# Patient Record
Sex: Male | Born: 1995 | Race: White | Hispanic: No | Marital: Single | Smoking: Never smoker
Health system: Southern US, Community
[De-identification: ages and names within clinical notes are randomized; demographics above are authoritative.]

## PROBLEM LIST (undated history)

## (undated) DIAGNOSIS — R002 Palpitations: Secondary | ICD-10-CM

## (undated) DIAGNOSIS — A048 Other specified bacterial intestinal infections: Secondary | ICD-10-CM

---
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
## 2014-11-22 ENCOUNTER — Emergency Department (HOSPITAL_COMMUNITY): Payer: BLUE CROSS/BLUE SHIELD

## 2014-11-22 ENCOUNTER — Encounter (HOSPITAL_COMMUNITY): Payer: Self-pay | Admitting: Emergency Medicine

## 2014-11-22 ENCOUNTER — Emergency Department (HOSPITAL_COMMUNITY)
Admission: EM | Admit: 2014-11-22 | Discharge: 2014-11-22 | Disposition: A | Discharge status: Home / Self Care | Payer: BLUE CROSS/BLUE SHIELD | Attending: Emergency Medicine | Admitting: Emergency Medicine

## 2014-11-22 DIAGNOSIS — Z880 Allergy status to penicillin: Secondary | ICD-10-CM | POA: Insufficient documentation

## 2014-11-22 DIAGNOSIS — K921 Melena: Secondary | ICD-10-CM | POA: Insufficient documentation

## 2014-11-22 DIAGNOSIS — R42 Dizziness and giddiness: Secondary | ICD-10-CM | POA: Insufficient documentation

## 2014-11-22 DIAGNOSIS — Z8619 Personal history of other infectious and parasitic diseases: Secondary | ICD-10-CM | POA: Insufficient documentation

## 2014-11-22 DIAGNOSIS — Z79899 Other long term (current) drug therapy: Secondary | ICD-10-CM | POA: Insufficient documentation

## 2014-11-22 DIAGNOSIS — R109 Unspecified abdominal pain: Secondary | ICD-10-CM | POA: Diagnosis present

## 2014-11-22 DIAGNOSIS — K529 Noninfective gastroenteritis and colitis, unspecified: Secondary | ICD-10-CM | POA: Diagnosis not present

## 2014-11-22 HISTORY — DX: Other specified bacterial intestinal infections: A04.8

## 2014-11-22 LAB — URINALYSIS, ROUTINE W REFLEX MICROSCOPIC
Bilirubin Urine: NEGATIVE
Glucose, UA: NEGATIVE mg/dL
HGB URINE DIPSTICK: NEGATIVE
Ketones, ur: NEGATIVE mg/dL
LEUKOCYTES UA: NEGATIVE
Nitrite: NEGATIVE
Protein, ur: NEGATIVE mg/dL
Specific Gravity, Urine: 1.013 (ref 1.005–1.030)
pH: 5.5 (ref 5.0–8.0)

## 2014-11-22 LAB — I-STAT CHEM 8, ED
BUN: 17 mg/dL (ref 6–20)
BUN: 16 mg/dL (ref 6–20)
CALCIUM ION: 1.17 mmol/L (ref 1.12–1.23)
CREATININE: 0.8 mg/dL (ref 0.61–1.24)
Calcium, Ion: 1.21 mmol/L (ref 1.12–1.23)
Chloride: 101 mmol/L (ref 101–111)
Chloride: 104 mmol/L (ref 101–111)
Creatinine, Ser: 1 mg/dL (ref 0.61–1.24)
GLUCOSE: 92 mg/dL (ref 65–99)
Glucose, Bld: 115 mg/dL — ABNORMAL HIGH (ref 65–99)
HCT: 44 % (ref 39.0–52.0)
HCT: 38 % — ABNORMAL LOW (ref 39.0–52.0)
Hemoglobin: 15 g/dL (ref 13.0–17.0)
Hemoglobin: 12.9 g/dL — ABNORMAL LOW (ref 13.0–17.0)
Potassium: 3.7 mmol/L (ref 3.5–5.1)
Potassium: 4.2 mmol/L (ref 3.5–5.1)
SODIUM: 140 mmol/L (ref 135–145)
Sodium: 140 mmol/L (ref 135–145)
TCO2: 26 mmol/L (ref 0–100)
TCO2: 24 mmol/L (ref 0–100)

## 2014-11-22 LAB — CBC
HCT: 42.2 % (ref 39.0–52.0)
Hemoglobin: 14.4 g/dL (ref 13.0–17.0)
MCH: 31.2 pg (ref 26.0–34.0)
MCHC: 34.1 g/dL (ref 30.0–36.0)
MCV: 91.5 fL (ref 78.0–100.0)
PLATELETS: 302 10*3/uL (ref 150–400)
RBC: 4.61 MIL/uL (ref 4.22–5.81)
RDW: 12.4 % (ref 11.5–15.5)
WBC: 10.7 10*3/uL — AB (ref 4.0–10.5)

## 2014-11-22 LAB — COMPREHENSIVE METABOLIC PANEL
ALT: 23 U/L (ref 17–63)
AST: 51 U/L — AB (ref 15–41)
Albumin: 4.2 g/dL (ref 3.5–5.0)
Alkaline Phosphatase: 61 U/L (ref 38–126)
Anion gap: 6 (ref 5–15)
BILIRUBIN TOTAL: 0.8 mg/dL (ref 0.3–1.2)
BUN: 17 mg/dL (ref 6–20)
CALCIUM: 9 mg/dL (ref 8.9–10.3)
CO2: 27 mmol/L (ref 22–32)
CREATININE: 0.95 mg/dL (ref 0.61–1.24)
Chloride: 105 mmol/L (ref 101–111)
Glucose, Bld: 119 mg/dL — ABNORMAL HIGH (ref 65–99)
Potassium: 3.8 mmol/L (ref 3.5–5.1)
SODIUM: 138 mmol/L (ref 135–145)
TOTAL PROTEIN: 6.8 g/dL (ref 6.5–8.1)

## 2014-11-22 LAB — POC OCCULT BLOOD, ED
Fecal Occult Bld: POSITIVE — AB
Fecal Occult Bld: POSITIVE — AB

## 2014-11-22 LAB — PROTIME-INR
INR: 1.04 (ref 0.00–1.49)
PROTHROMBIN TIME: 13.8 s (ref 11.6–15.2)

## 2014-11-22 LAB — SAMPLE TO BLOOD BANK

## 2014-11-22 LAB — LIPASE, BLOOD: LIPASE: 34 U/L (ref 11–51)

## 2014-11-22 MED ORDER — METRONIDAZOLE 500 MG PO TABS: 500.0000 mg | ORAL_TABLET | Freq: Three times a day (TID) | ORAL | Status: AC

## 2014-11-22 MED ORDER — SODIUM CHLORIDE 0.9 % IV BOLUS (SEPSIS)
2000.0000 mL | Freq: Once | INTRAVENOUS | Status: AC
  Administered 2014-11-22: 2000 mL via INTRAVENOUS

## 2014-11-22 MED ORDER — MORPHINE SULFATE (PF) 4 MG/ML IV SOLN
4.0000 mg | Freq: Once | INTRAVENOUS | Status: AC
  Administered 2014-11-22: 4 mg via INTRAVENOUS

## 2014-11-22 MED ORDER — METHYLPREDNISOLONE SODIUM SUCC 125 MG IJ SOLR
125.0000 mg | Freq: Once | INTRAMUSCULAR | Status: AC
  Administered 2014-11-22: 125 mg via INTRAVENOUS

## 2014-11-22 MED ORDER — METRONIDAZOLE 500 MG PO TABS
500.0000 mg | ORAL_TABLET | Freq: Once | ORAL | Status: AC
  Administered 2014-11-22: 500 mg via ORAL

## 2014-11-22 MED ORDER — CIPROFLOXACIN HCL 500 MG PO TABS: 500.0000 mg | ORAL_TABLET | Freq: Two times a day (BID) | ORAL | Status: AC

## 2014-11-22 MED ORDER — CIPROFLOXACIN HCL 500 MG PO TABS
500.0000 mg | ORAL_TABLET | Freq: Once | ORAL | Status: AC
Start: 2014-11-22 — End: 2014-11-22
  Administered 2014-11-22: 500 mg via ORAL

## 2014-11-22 MED ORDER — ONDANSETRON HCL 4 MG/2ML IJ SOLN
4.0000 mg | Freq: Once | INTRAMUSCULAR | Status: AC
  Administered 2014-11-22: 4 mg via INTRAVENOUS

## 2014-11-22 MED ORDER — DICYCLOMINE HCL 20 MG PO TABS: 20.0000 mg | ORAL_TABLET | Freq: Two times a day (BID) | ORAL | Status: AC

## 2014-11-22 MED ORDER — IOHEXOL 300 MG/ML  SOLN
100.0000 mL | Freq: Once | INTRAMUSCULAR | Status: AC | PRN
  Administered 2014-11-22: 100 mL via INTRAVENOUS

## 2014-11-22 MED ORDER — IOHEXOL 300 MG/ML  SOLN
25.0000 mL | Freq: Once | INTRAMUSCULAR | Status: AC | PRN
  Administered 2014-11-22: 25 mL via ORAL

## 2014-11-22 NOTE — Discharge Instructions (Signed)
Take Ciprofloxacin and Flagyl as prescribed for symptoms. Take Bentyl as needed for pain. Follow up with a gastroenterologist. Make an appointment as soon as possible for a visit. It is possible that your symptoms may be due to Crohn's colitis, but you have no history of this. It is important to follow up with a gastroenterologist for this reason as they can perform further testing. It is likely that the blood in your stool will resolve with time. Return to the emergency department if you develop a fever over 100.4, loss of consciousness, severe worsening of your abdominal pain, lightheadedness or dizziness with change in position, or any of the symptoms listed below.  Colitis Colitis is inflammation of the colon. Colitis may last a short time (acute) or it may last a long time (chronic). CAUSES This condition may be caused by:  Viruses.  Bacteria.  Reactions to medicine.  Certain autoimmune diseases, such as Crohn disease or ulcerative colitis. SYMPTOMS Symptoms of this condition include:  Diarrhea.  Passing bloody or tarry stool.  Pain.  Fever.  Vomiting.  Tiredness (fatigue).  Weight loss.  Bloating.  Sudden increase in abdominal pain.  Having fewer bowel movements than usual. DIAGNOSIS This condition is diagnosed with a stool test or a blood test. You may also have other tests, including X-rays, a CT scan, or a colonoscopy. TREATMENT Treatment may include:  Resting the bowel. This involves not eating or drinking for a period of time.  Fluids that are given through an IV tube.  Medicine for pain and diarrhea.  Antibiotic medicines.  Cortisone medicines.  Surgery. HOME CARE INSTRUCTIONS Eating and Drinking  Follow instructions from your health care provider about eating or drinking restrictions.  Drink enough fluid to keep your urine clear or pale yellow.  Work with a dietitian to determine which foods cause your condition to flare up.  Avoid foods that  cause flare-ups.  Eat a well-balanced diet. Medicines  Take over-the-counter and prescription medicines only as told by your health care provider.  If you were prescribed an antibiotic medicine, take it as told by your health care provider. Do not stop taking the antibiotic even if you start to feel better. General Instructions  Keep all follow-up visits as told by your health care provider. This is important. SEEK MEDICAL CARE IF:  Your symptoms do not go away.  You develop new symptoms. SEEK IMMEDIATE MEDICAL CARE IF:  You have a fever that does not go away with treatment.  You develop chills.  You have extreme weakness, fainting, or dehydration.  You have repeated vomiting.  You develop severe pain in your abdomen.   This information is not intended to replace advice given to you by your health care provider. Make sure you discuss any questions you have with your health care provider.   Document Released: 01/31/2004 Document Revised: 09/13/2014 Document Reviewed: 04/17/2014 Elsevier Interactive Patient Education [employer]2016 Elsevier Inc.

## 2014-11-22 NOTE — ED Notes (Signed)
Pt aware that a urine sample is needed. Pt given urinal and ask to press call bell when he has a sample.

## 2014-11-22 NOTE — ED Provider Notes (Signed)
CSN: [id number]     Arrival date & time 11/22/14  0013 History   First MD Initiated Contact with Patient 11/22/14 0032     Chief Complaint  Patient presents with  . Abdominal Pain  . Blood In Stools     (Consider location/radiation/quality/duration/timing/severity/associated sxs/prior Treatment) HPI Comments: 19 year old male since to the emergency department for further evaluation of hematochezia. Patient reports that he was using the restroom tonight when he had onset of abdominal pain followed by an episode of gross hematochezia and an episode of emesis, nonbloody. He denies passing any stool. No modifying factors since onset of his symptoms. He reports that he has had abdominal pain proceeding his hematochezia 1.5 months. Abdominal pain is intermittent and waxing and waning in severity. Patient describes the pain as sharp. He reports that it is present primarily in his upper abdomen. He has vomited at times with this pain. Patient initially thought he had a gluten intolerance so he changed his diet. This did not provide any long-standing relief so he [facility]. He had a normal x-ray at this time and was given magnesium citrate to take. This has also not relieved his intermittent pain. Patient denies a hx of abdominal surgeries. No known personal or FHx of diverticulitis. Patient is sexual active with a male partner. He does engage in rectal intercourse.  Patient is a 19 y.o. male presenting with abdominal pain. The history is provided by the patient. No language interpreter was used.  Abdominal Pain Associated symptoms: nausea and vomiting   Associated symptoms: no chest pain, no dysuria, no fever and no shortness of breath     Past Medical History  Diagnosis Date  . Palpitations   . H. pylori infection    History reviewed. No pertinent past surgical history. History reviewed. No pertinent family history. Social History  Substance Use Topics  . Smoking  status: Never Smoker   . Smokeless tobacco: None  . Alcohol Use: Yes     Comment: socially    Review of Systems  Constitutional: Negative for fever.  Respiratory: Negative for shortness of breath.   Cardiovascular: Negative for chest pain.  Gastrointestinal: Positive for nausea, vomiting, abdominal pain and blood in stool.  Genitourinary: Negative for dysuria.  Neurological: Positive for light-headedness. Negative for syncope.  All other systems reviewed and are negative.   Allergies  Amoxicillin and Other  Home Medications   Prior to Admission medications   Medication Sig Start Date End Date Taking? Authorizing Provider  omeprazole (PRILOSEC) 20 MG capsule Take 20 mg by mouth daily.   Yes Historical Provider, MD  azithromycin (ZITHROMAX) 250 MG tablet Take 2 tablets PO day 1, then 1 tab PO daily x4 days. Patient not taking: Reported on 11/22/2014 02/10/13   Samuel JesterKathleen McManus, DO   BP 121/73 mmHg  Pulse 80  Temp(Src) 97.5 F (36.4 C) (Oral)  Resp 16  SpO2 100%   Physical Exam  Constitutional: He is oriented to person, place, and time. He appears well-developed and well-nourished. No distress.  Nontoxic/nonseptic appearing  HENT:  Head: Normocephalic and atraumatic.  Eyes: Conjunctivae and EOM are normal. No scleral icterus.  Neck: Normal range of motion.  Cardiovascular: Normal rate, regular rhythm and intact distal pulses.   Pulmonary/Chest: Effort normal. No respiratory distress.  Respirations even and unlabored  Abdominal: Soft. He exhibits no distension. There is tenderness. There is no rebound and no guarding.  Mild tenderness in the left upper quadrant. No masses or  peritoneal signs. No guarding.  Genitourinary:  Chaperoned rectal exam positive for gross maroon blood. Normal rectal tone. No hemorrhoids or fissure appreciated.  Musculoskeletal: Normal range of motion.  Neurological: He is alert and oriented to person, place, and time. He exhibits normal muscle tone.  Coordination normal.  Skin: Skin is warm and dry. No rash noted. He is not diaphoretic. No erythema. No pallor.  Psychiatric: He has a normal mood and affect. His behavior is normal.  Nursing note and vitals reviewed.   ED Course  Procedures (including critical care time) Labs Review Labs Reviewed  COMPREHENSIVE METABOLIC PANEL - Abnormal; Notable for the following:    Glucose, Bld 119 (*)    AST 51 (*)    All other components within normal limits  CBC - Abnormal; Notable for the following:    WBC 10.7 (*)    All other components within normal limits  I-STAT CHEM 8, ED - Abnormal; Notable for the following:    Glucose, Bld 115 (*)    All other components within normal limits  POC OCCULT BLOOD, ED - Abnormal; Notable for the following:    Fecal Occult Bld POSITIVE (*)    All other components within normal limits  POC OCCULT BLOOD, ED - Abnormal; Notable for the following:    Fecal Occult Bld POSITIVE (*)    All other components within normal limits  I-STAT CHEM 8, ED - Abnormal; Notable for the following:    Hemoglobin 12.9 (*)    HCT 38.0 (*)    All other components within normal limits  LIPASE, BLOOD  URINALYSIS, ROUTINE W REFLEX MICROSCOPIC (NOT AT [facility])  PROTIME-INR  SAMPLE TO BLOOD BANK    Imaging Review Ct Abdomen Pelvis W Contrast  11/22/2014  CLINICAL DATA:  Abdominal pain for several months. Hematochezia for 1 day. EXAM: CT ABDOMEN AND PELVIS WITH CONTRAST TECHNIQUE: Multidetector CT imaging of the abdomen and pelvis was performed using the standard protocol following bolus administration of intravenous contrast. CONTRAST:  OMNIPAQUE IOHEXOL 300 MG/ML  SOLN COMPARISON:  None. FINDINGS: Lower chest:  The included lung bases are clear. Liver: Normal.  No focal lesion. Hepatobiliary: Gallbladder physiologically distended. No calcified stone. No biliary dilatation. Pancreas: Normal.  No ductal dilatation or inflammation. Spleen: Normal. Adrenal glands: No nodule.  Kidneys: Symmetric renal enhancement. No hydronephrosis. No perinephric stranding or focal renal abnormality. Incidental note of small accessory renal arteries. Stomach/Bowel: Stomach physiologically distended. There are no dilated or thickened small bowel loops. The terminal ileum appears normal. Liquid and formed stool throughout the colon. Possible mild colonic wall thickening involving the hepatic flexure of the colon versus nondistention. No perienteric inflammatory change. The appendix is normal. Vascular/Lymphatic: No retroperitoneal adenopathy. Abdominal aorta is normal in caliber. Reproductive: Prostate gland normal in size. Bladder: Physiologically distended, no wall thickening. Other: No free air, free fluid, or intra-abdominal fluid collection. Tiny fat containing umbilical hernia. Musculoskeletal: There are no acute or suspicious osseous abnormalities. New bone island in the sacrum. Minimal broad-based levo scoliotic curvature of the lumbar spine. IMPRESSION: 1. Liquid and formed stool throughout the colon. Equivocal colonic wall thickening involving the hepatic flexure. This may reflect minimal colitis, however favor nondistention. Stomach and small bowel appear normal. 2. Otherwise no acute abnormality. Electronically Signed   By: Rubye Oaks M.D.   On: 11/22/2014 04:07     I have personally reviewed and evaluated these images and lab results as part of my medical decision-making.   EKG Interpretation None  MDM   Final diagnoses:  Colitis  Hematochezia    19 year old male with no significant past medical history presents to the emergency department for evaluation of abdominal pain and hematochezia. Patient with 1 episode of bright red blood per rectum this evening. He is grossly Hemoccult positive on exam. He reports that he has been experiencing intermittent abdominal pain over the last 1.5 months. He is afebrile and hemodynamically stable today. Only slight decrease in Hgb  over ED course from 14.4 to 12.9 which is fairly appropriate given blood draw. No other signs of acute blood loss such as elevated BUN, tachycardia, or hypotension. Patient denies lightheadedness or dizziness with position change. Orthostatic VSS.  CT today reveals a colitis. Question infectious etiology. Will cover with Ciprofloxacin and Flagyl. Crohn's colitis remains on the differential. Patient has no PHx of Crohn's. He has been given one dose of steroids in the ED. No evidence of diverticulosis on CT to suggest diverticular bleed. Will send for GI follow up.  Patient states that he is feeling better. He has no c/o abdominal pain. Given his reassuring laboratory workup and stable vital signs, do not believe admission is indicated at this time. Patient has been instructed to follow-up with a gastroenterologist on an outpatient basis. Have discussed and provided reasons to return to the emergency department for further evaluation of his symptoms. Bentyl prescribed for pain control as needed. Patient agreeable to plan with no unaddressed concerns. Patient discharged in satisfactory condition.   Filed Vitals:   11/22/14 0019 11/22/14 0235 11/22/14 0556  BP: 124/74 121/73 125/77  Pulse: 95 80 72  Temp: 97.5 F (36.4 C)    TempSrc: Oral    Resp: SpO2: 100% 100% 100%     Antony Madura, PA-C 11/22/14 0557  Gilda Crease, MD 11/22/14 [id number]

## 2014-11-22 NOTE — ED Notes (Signed)
Pt states that he has had ongoing abdominal pain x several months but tonight started having bleeding with his stools. Alert and oriented.

## 2016-06-27 IMAGING — CT CT ABD-PELV W/ CM
2 of 4 series · 16 of 46 positions shown, 18 images · IV contrast (100 ML OMNI 300)
Comparison: None.

CLINICAL DATA: Abdominal pain for several months. Hematochezia for
1 day.

EXAM:
CT ABDOMEN AND PELVIS WITH CONTRAST
TECHNIQUE: Multidetector CT imaging of the abdomen and pelvis was performed
using the standard protocol following bolus administration of
intravenous contrast.
CONTRAST:  100mL OMNIPAQUE IOHEXOL 300 MG/ML  SOLN

[Series 2: abd/pel with · axial · 0.70mm/px · z∈[+1239,+1639]mm · 13 of 88 slices shown, 15 images]
[im 4/88  soft-tissue]
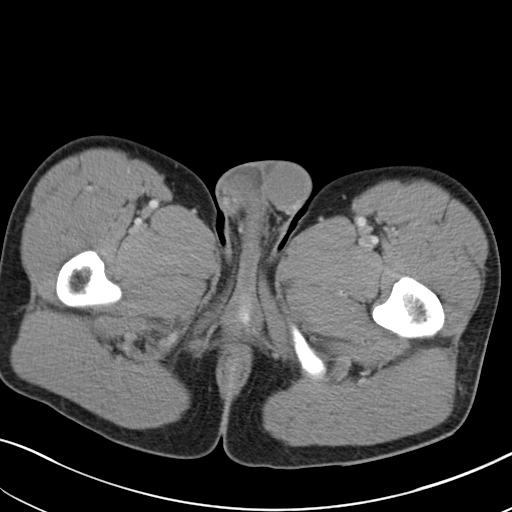
[im 4/88  bone]
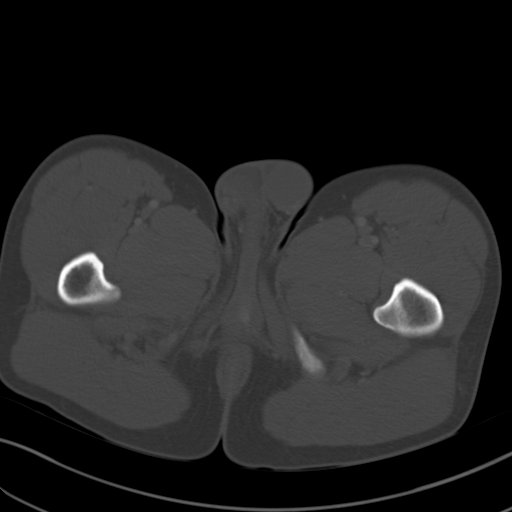
[im 11/88  soft-tissue]
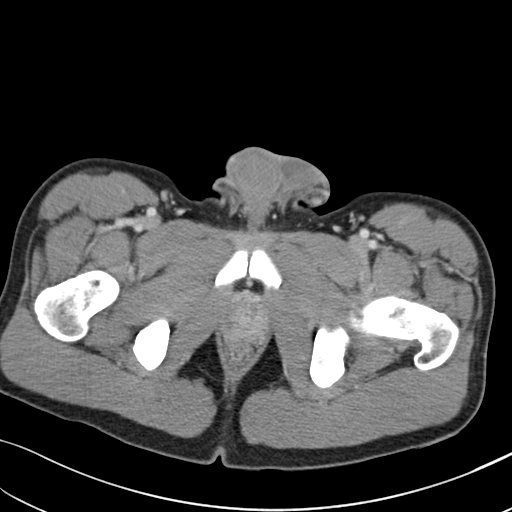
[im 17/88  soft-tissue]
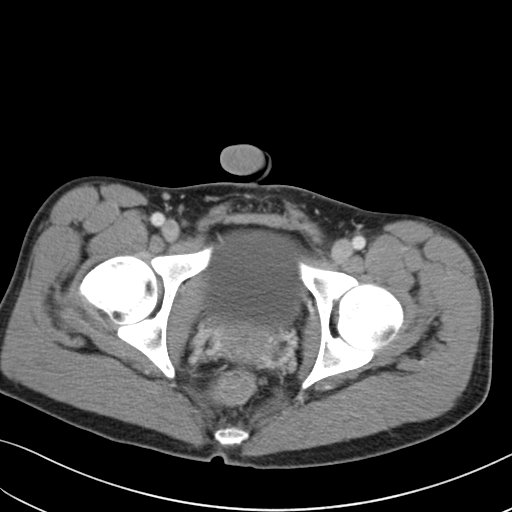
[im 24/88  soft-tissue]
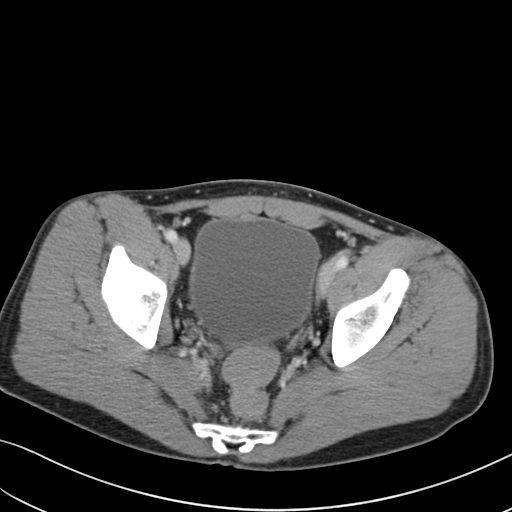
[im 31/88  soft-tissue]
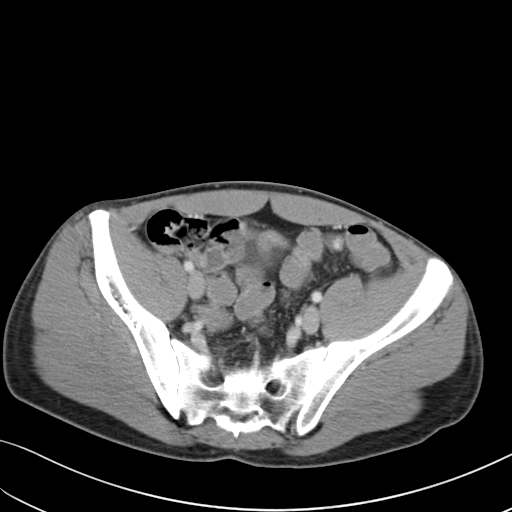
[im 37/88  soft-tissue]
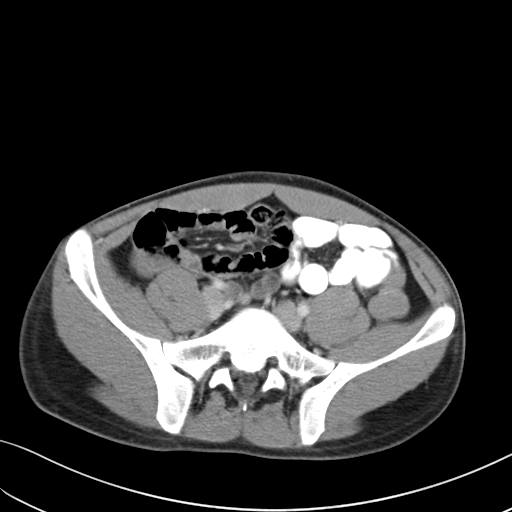
[im 44/88  soft-tissue]
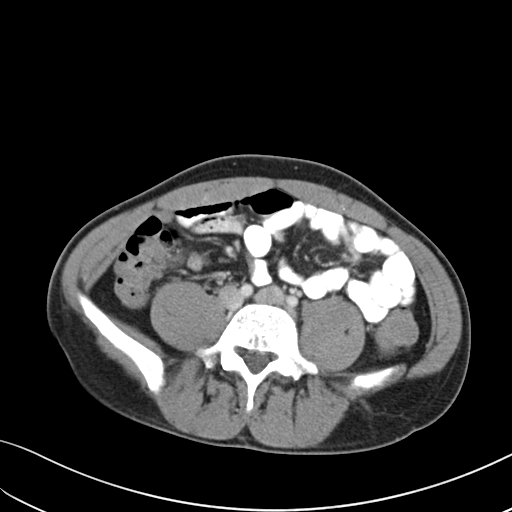
[im 51/88  soft-tissue]
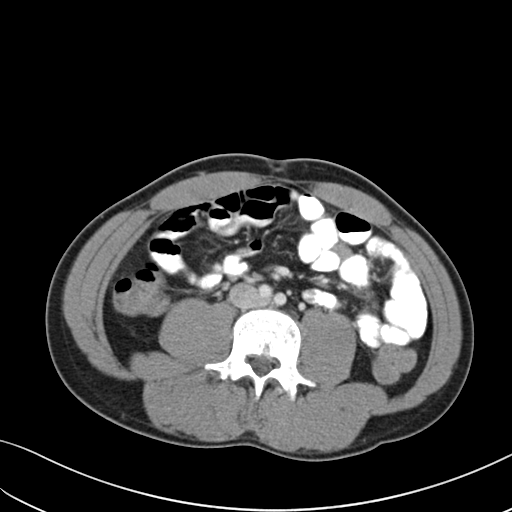
[im 57/88  soft-tissue]
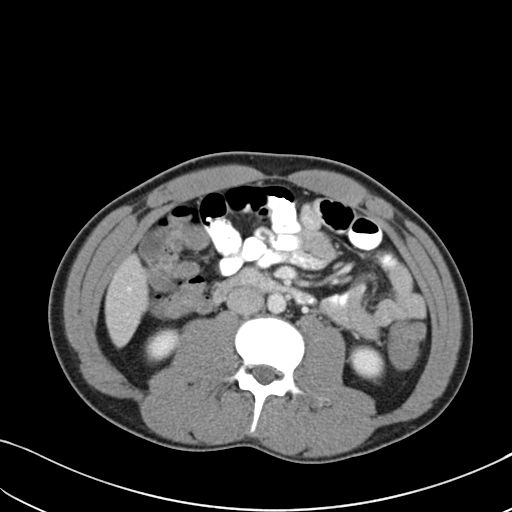
[im 57/88  bone]
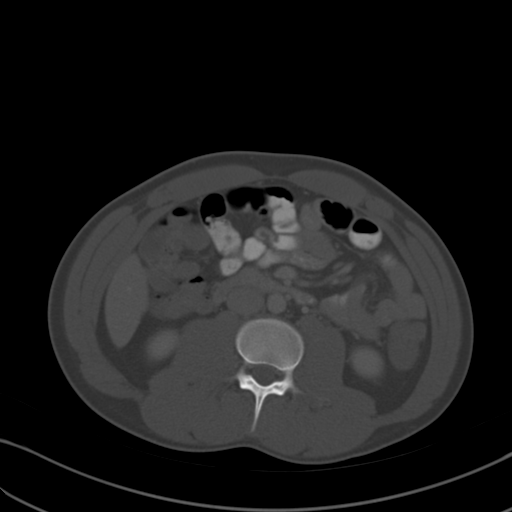
[im 64/88  soft-tissue]
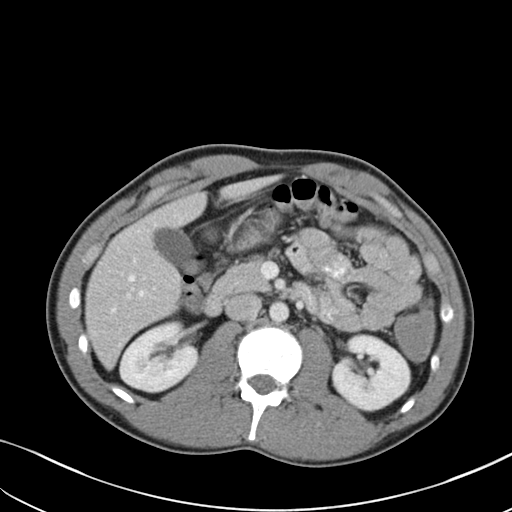
[im 71/88  soft-tissue]
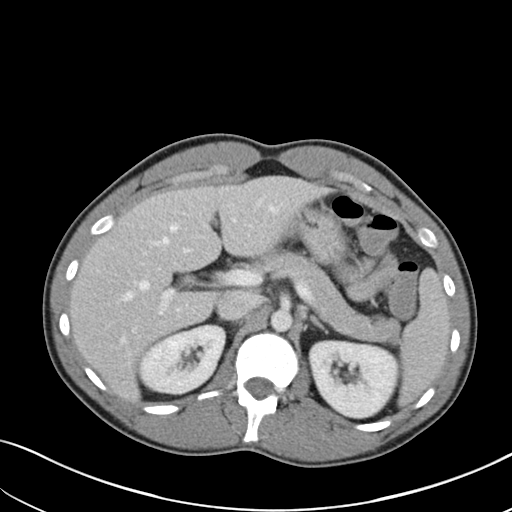
[im 77/88  soft-tissue]
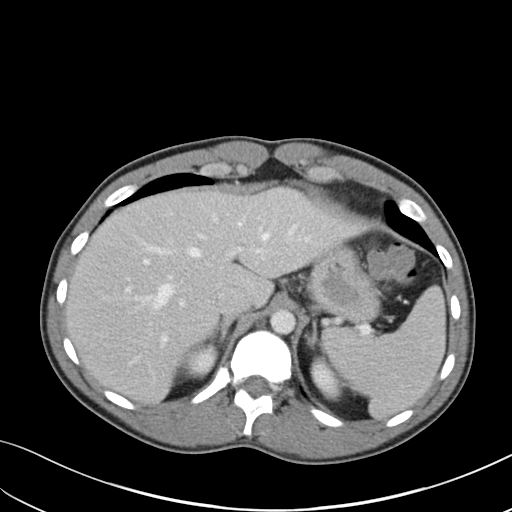
[im 84/88  soft-tissue]
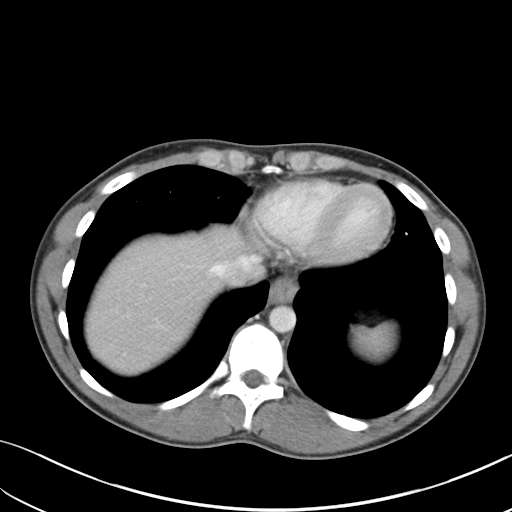

[Series 4: coronal a/|p · coronal · 0.64mm/px · 3 of 73 slices shown]
[im 25/73  soft-tissue]
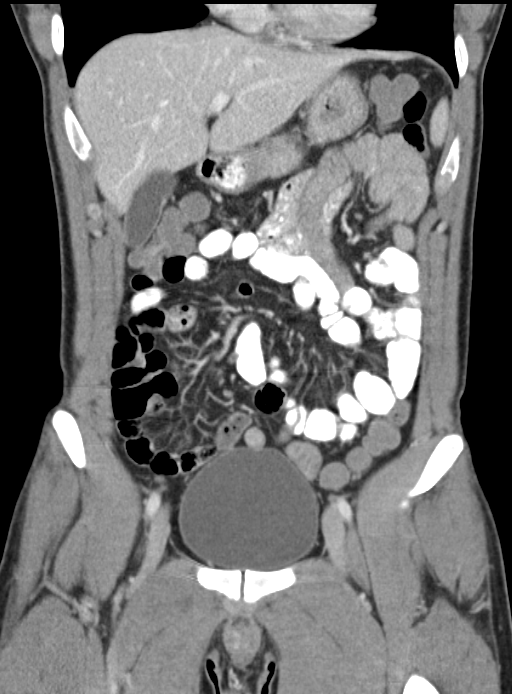
[im 33/73  soft-tissue]
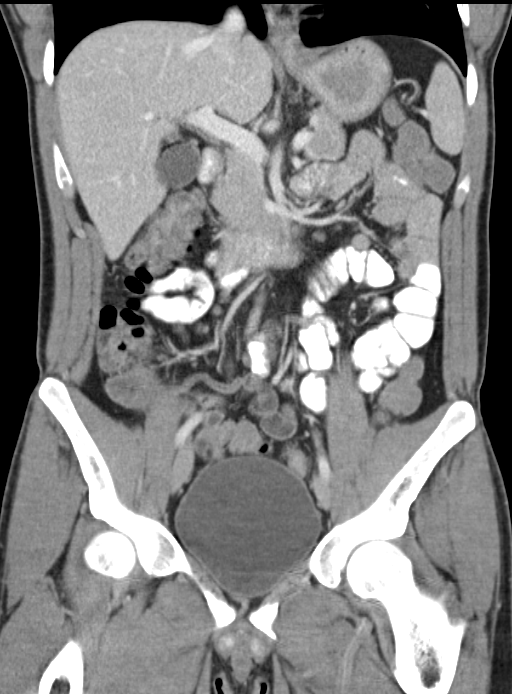
[im 41/73  soft-tissue]
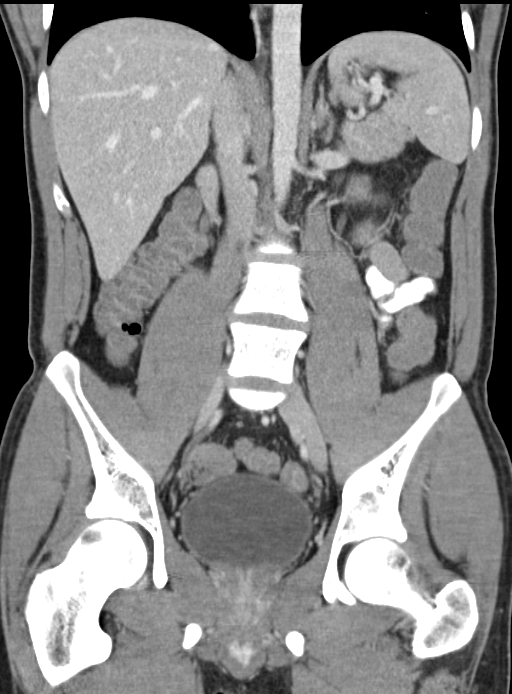

[16 of 46 positions shown; findings below may reference images not displayed]

FINDINGS: Lower chest:  The included lung bases are clear.

Liver: Normal.  No focal lesion.

Hepatobiliary: Gallbladder physiologically distended. No calcified
stone. No biliary dilatation.

Pancreas: Normal.  No ductal dilatation or inflammation.

Spleen: Normal.

Adrenal glands: No nodule.

Kidneys: Symmetric renal enhancement. No hydronephrosis. No
perinephric stranding or focal renal abnormality. Incidental note of
small accessory renal arteries.

Stomach/Bowel: Stomach physiologically distended. There are no
dilated or thickened small bowel loops. The terminal ileum appears
normal. Liquid and formed stool throughout the colon. Possible mild
colonic wall thickening involving the hepatic flexure of the colon
versus nondistention. No perienteric inflammatory change. The
appendix is normal.

Vascular/Lymphatic: No retroperitoneal adenopathy. Abdominal aorta
is normal in caliber.

Reproductive: Prostate gland normal in size.

Bladder: Physiologically distended, no wall thickening.

Other: No free air, free fluid, or intra-abdominal fluid collection.
Tiny fat containing umbilical hernia.

Musculoskeletal: There are no acute or suspicious osseous
abnormalities. New bone island in the sacrum. Minimal broad-based
levo scoliotic curvature of the lumbar spine.
IMPRESSION: 1. Liquid and formed stool throughout the colon. Equivocal colonic
wall thickening involving the hepatic flexure. This may reflect
minimal colitis, however favor nondistention. Stomach and small
bowel appear normal.
2. Otherwise no acute abnormality.
